# Patient Record
Sex: Female | Born: 1970 | Race: White | Hispanic: No | Smoking: Never smoker
Health system: Southern US, Community
[De-identification: ages and names within clinical notes are randomized; demographics above are authoritative.]

---

## 2007-08-25 ENCOUNTER — Ambulatory Visit: Payer: Self-pay | Admitting: Vascular Surgery

## 2010-05-21 ENCOUNTER — Emergency Department (HOSPITAL_COMMUNITY): Payer: No Typology Code available for payment source

## 2010-05-21 ENCOUNTER — Emergency Department (HOSPITAL_COMMUNITY)
Admission: EM | Admit: 2010-05-21 | Discharge: 2010-05-21 | Disposition: A | Discharge status: Home / Self Care | Payer: No Typology Code available for payment source | Attending: Emergency Medicine | Admitting: Emergency Medicine

## 2010-05-21 DIAGNOSIS — M542 Cervicalgia: Secondary | ICD-10-CM | POA: Insufficient documentation

## 2010-05-21 DIAGNOSIS — S335XXA Sprain of ligaments of lumbar spine, initial encounter: Secondary | ICD-10-CM | POA: Insufficient documentation

## 2010-05-21 DIAGNOSIS — M5430 Sciatica, unspecified side: Secondary | ICD-10-CM | POA: Insufficient documentation

## 2010-05-21 DIAGNOSIS — Y92410 Unspecified street and highway as the place of occurrence of the external cause: Secondary | ICD-10-CM | POA: Insufficient documentation

## 2010-07-09 NOTE — Procedures (Signed)
DUPLEX DEEP VENOUS EXAM - LOWER EXTREMITY   INDICATION:  Bilateral leg swelling.   HISTORY:  Edema:  Yes.  Trauma/Surgery:  No.  Pain:  With walking.  PE:  No.  Previous DVT:  No.  Anticoagulants:  No.  Other:  CHF.   DUPLEX EXAM:                CFV   SFV   PopV  PTV    GSV                R  L  R  L  R  L  R   L  R  L  Thrombosis    O  o  o  o  o  o  o   o  o  o  Spontaneous   +  +  +  +  +  +  +   +  +  +  Phasic        +  +  +  +  +  +  +   +  +  +  Augmentation  +  +  +  +  +  +  +   +  +  +  Compressible  +  +  +  +  +  +  +   +  +  +  Competent     +  +  +  +  +  +  +   +  +  +   Legend:  + - yes  o - no  p - partial  D - decreased   IMPRESSION:  No evidence of deep venous thrombosis or reflux noted in  the bilateral lower extremities.    _____________________________  Janetta Hora Fields, MD   CH/MEDQ  D:  08/25/2007  T:  08/25/2007  Job:  [id number]

## 2010-07-09 NOTE — Assessment & Plan Note (Signed)
OFFICE VISIT   Kaufman, Bethany  DOB:  1970/12/03                                       08/25/2007  CHART#:[id number]   The patient is a 40 year old female who complains of chronic leg  swelling.  She has also had areas of black and blue skin color changes  for approximately 2 months.  She has been on diuretics for greater than  2 years for leg swelling.  She denies any prior history of DVT.  She  denies any family history of varicose veins.  She states that her leg  swells as the day goes on.  She has a feeling of heaviness and aching as  the day goes on.  She denies history of diabetes, hypertension, of  coronary artery disease.  She has no history of elevated cholesterol.   PAST SURGICAL HISTORY:  Remarkable for C-section on 3 separate  occasions.  She also has had appendectomy, cholecystectomy and right  knee operation.   MEDICATIONS:  Include Proventil 2 sprays every 4 to 6 hours, Vicodin  p.r.n., alprazolam 0.5 mg twice a day for anxiety, Naprosyn 500 mg  p.r.n., amitriptyline 50 mg nightly, Skelaxin 800 mg q. of q.6 hours for  muscle spasms.  Potassium chloride extended release 10 mEq 2 tablets a  day, furosemide 40 mg once a day, Lyrica 50 mg 2 at bedtime, __________  50 mg 1 tablet daily.   She is allergic to aspirin, which causes nausea.   FAMILY HISTORY:  Unremarkable.   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  She has 3  children.  She is a former smoker and quit approximately 3 months ago.  She does not consume alcohol regularly.   REVIEW OF SYSTEMS:  She has had some recent weight gain.  She is 5 feet  10 inches, 270 pounds.  She has occasional chest pain and shortness of breath with exertion.  She also has a history of bronchitis and asthma.  She denies history of GI bleeding.  Psychiatric, ENT and hematologic review of systems are all negative.  Neurologically she has some occasional dizziness and headaches.  VASCULAR:   She denies any history of TIA or stroke.  RENAL:  She has some urinary frequency.   PHYSICAL EXAM:  Blood pressure 123/91 in the left arm, pulse is 79 and  regular.  HEENT:  Unremarkable.  Neck has 2+ carotid pulses without  bruit.  Chest:  Clear to auscultation.  Cardiac exam is regular rate  rhythm.  Abdomen is obese, soft, nontender, nondistended with no masses.  Extremities:  She has trace edema in the lower extremities.  She also  has a few scattered spider-type varicosities bilaterally in the calf and  thigh.  She has 2+ brachial, radial, and femoral pulses bilaterally.  She has 1+ dorsalis pedis and posterior tibial pulses bilaterally.   She had a venous duplex exam today which showed no evidence of  superficial or deep venous thrombosis.  She had no incompetence of her  superficial or deep femoral vein systems.   The patient has bilateral chronic leg swelling and pain.  This is  probably multifactorial in origin.  She has a problem with obesity as  well as maybe some mild varicosities in her lower extremities.  She also  has most likely some degenerative joint changes.  I do not believe that  she has incompetent valves in her veins based on her ultrasound.  Therefore, I do not believe she would benefit from laser ablation of her  saphenous vein.  I believe the best treatment for her would be a weight  loss program and also bilateral compression stockings of her lower  extremities.  I have prescribed these for her today.  She will also try  to take some measures to continue to lose weight over time.  She will  follow up with me on an as-needed basis.   Janetta Hora. Fields, MD  Electronically Signed   CEF/MEDQ  D:  08/29/2007  T:  08/30/2007  Job:  [id number]
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
## 2019-11-06 ENCOUNTER — Other Ambulatory Visit: Payer: Self-pay

## 2019-11-06 ENCOUNTER — Encounter (HOSPITAL_COMMUNITY): Payer: Self-pay

## 2019-11-06 ENCOUNTER — Ambulatory Visit (HOSPITAL_COMMUNITY)
Admission: EM | Admit: 2019-11-06 | Discharge: 2019-11-06 | Disposition: A | Discharge status: Home / Self Care | Payer: BC Managed Care – PPO | Attending: Physician Assistant | Admitting: Physician Assistant

## 2019-11-06 DIAGNOSIS — J069 Acute upper respiratory infection, unspecified: Secondary | ICD-10-CM

## 2019-11-06 MED ORDER — FLUTICASONE PROPIONATE 50 MCG/ACT NA SUSP: 1.0000 | Freq: Every day | NASAL | 2 refills | Status: AC

## 2019-11-06 MED ORDER — GUAIFENESIN-DM 100-10 MG/5ML PO SYRP: 5.0000 mL | ORAL_SOLUTION | ORAL | 0 refills | Status: AC | PRN

## 2019-11-06 NOTE — ED Triage Notes (Signed)
Pt presents with nasal congestion and cough x 3 days. States last time she felt this way it was allergies  Denies fever, sob, body aches. Mucinex and cetirizine gives no relief.  Pt refused COVID test.

## 2019-11-06 NOTE — Discharge Instructions (Signed)
Take medicines as prescribed [facility] over-the-counter Zyrtec  Hydrate well  You refused Covid testing, and your symptoms are consistent with early signs of Covid.  Because of this you need to quarantine for 10 days from symptom onset.  You may leave isolatio at the end of 10 days of all symptoms and improving if not had a fever for 24 hours leading up to this  If severe symptoms arise or shortness of breath, high fevers or other concerning symptoms return or go to the emergency department

## 2019-11-06 NOTE — ED Provider Notes (Signed)
Patient [facility]    CSN: [id number] Arrival date & time: 11/06/19  1610      History   Chief Complaint Chief Complaint  Patient presents with  . Nasal Congestion  . Cough    HPI Meyah Corle is a 48 y.o. female.   Patient presents for 3-day history of nasal congestion and cough.  She reports cough is nonproductive.  Has had clear nasal discharge.  Some facial pressure.  Some ear pressure.  No difficulty breathing.  Denies fever, chills, sore throat, nausea, vomiting or diarrhea.  No change in taste smell.  She did not receive Covid vaccines.  She would not like to be Covid tested today as she believes this is her allergies.  She has tried Claritin-D without any relief.  She had not been around by sick that she knows of.     History reviewed. No pertinent past medical history.  There are no problems to display for this patient.   History reviewed. No pertinent surgical history.  OB History   No obstetric history on file.      Home Medications    Prior to Admission medications   Medication Sig Start Date End Date Taking? Authorizing Provider  fluticasone (FLONASE) 50 MCG/ACT nasal spray Place 1 spray into both nostrils daily. 11/06/19   Rosario Kushner, Veryl Speak, PA-C  guaiFENesin-dextromethorphan (ROBITUSSIN DM) 100-10 MG/5ML syrup Take 5 mLs by mouth every 4 (four) hours as needed for cough. 11/06/19   Amado Andal, Veryl Speak, PA-C    Family History History reviewed. No pertinent family history.  Social History Social History   Tobacco Use  . Smoking status: Never Smoker  . Smokeless tobacco: Never Used  Substance Use Topics  . Alcohol use: Never  . Drug use: Never     Allergies   Strawberry extract, Aspirin, and Morphine   Review of Systems Review of Systems   Physical Exam Triage Vital Signs ED Triage Vitals  Enc Vitals Group     BP 11/06/19 1744 (!) 144/88     Pulse Rate 11/06/19 1744 86     Resp 11/06/19 1744 20     Temp 11/06/19 1744 98.9 F (37.2  C)     Temp Source 11/06/19 1744 Oral     SpO2 11/06/19 1744 96 %     Weight --      Height --      Head Circumference --      Peak Flow --      Pain Score 11/06/19 1742 0     Pain Loc --      Pain Edu? --      Excl. in GC? --    No data found.  Updated Vital Signs BP (!) 144/88 (BP Location: Left Arm)   Pulse 86   Temp 98.9 F (37.2 C) (Oral)   Resp 20   SpO2 96%   Visual Acuity Right Eye Distance:   Left Eye Distance:   Bilateral Distance:    Right Eye Near:   Left Eye Near:    Bilateral Near:     Physical Exam Vitals and nursing note reviewed.  Constitutional:      General: She is not in acute distress.    Appearance: She is well-developed. She is not ill-appearing.  HENT:     Head: Normocephalic and atraumatic.     Right Ear: Tympanic membrane normal.     Left Ear: Tympanic membrane normal.     Nose: Congestion present. No rhinorrhea.  Mouth/Throat:     Mouth: Mucous membranes are moist.     Pharynx: No oropharyngeal exudate or posterior oropharyngeal erythema.  Eyes:     Conjunctiva/sclera: Conjunctivae normal.  Cardiovascular:     Rate and Rhythm: Normal rate and regular rhythm.     Heart sounds: No murmur heard.   Pulmonary:     Effort: Pulmonary effort is normal. No respiratory distress.     Breath sounds: Normal breath sounds. No wheezing, rhonchi or rales.  Abdominal:     Palpations: Abdomen is soft.     Tenderness: There is no abdominal tenderness.  Musculoskeletal:     Cervical back: Neck supple.  Skin:    General: Skin is warm and dry.  Neurological:     Mental Status: She is alert.      UC Treatments / Results  Labs (all labs ordered are listed, but only abnormal results are displayed) Labs Reviewed - No data to display  EKG   Radiology No results found.  Procedures Procedures (including critical care time)  Medications Ordered in UC Medications - No data to display  Initial Impression / Assessment and Plan / UC  Course  I have reviewed the triage vital signs and the nursing notes.  Pertinent labs & imaging results that were available during my care of the patient were reviewed by me and considered in my medical decision making (see chart for details).     #Viral URI Patient 49 year old presenting with viral upper respiratory symptoms.  Afebrile with normal vital signs.  Reassuring exam.  Patient refused Covid testing.  Discussed with her that given she has upper respiratory symptoms and she is refusing Covid testing, that she should quarantine for 10 days from symptom onset per [facility] guidance.  Patient states she is concerned about missing work, I stated that Covid testing would actually allow her to return to work safely sooner as she should quarantine per the 10-day guidance that she is not receiving a test.  Patient still refuses.  We will treat her symptomatically.  Discussed return, follow-up and emergency for precautions.  Patient verbalized agreement understanding plan of care. Final Clinical Impressions(s) / UC Diagnoses   Final diagnoses:  Viral upper respiratory tract infection     Discharge Instructions     Take medicines as prescribed [facility] over-the-counter Zyrtec  Hydrate well  You refused Covid testing, and your symptoms are consistent with early signs of Covid.  Because of this you need to quarantine for 10 days from symptom onset.  You may leave isolatio at the end of 10 days of all symptoms and improving if not had a fever for 24 hours leading up to this  If severe symptoms arise or shortness of breath, high fevers or other concerning symptoms return or go to the emergency department      ED Prescriptions    Medication Sig Dispense Auth. Provider   guaiFENesin-dextromethorphan (ROBITUSSIN DM) 100-10 MG/5ML syrup Take 5 mLs by mouth every 4 (four) hours as needed for cough. 118 mL Lizza Huffaker, Veryl Speak, PA-C   fluticasone (FLONASE) 50 MCG/ACT nasal spray Place 1 spray into both  nostrils daily. 15.8 mL Jamaine Quintin, Veryl Speak, PA-C     PDMP not reviewed this encounter.   Hermelinda Medicus, PA-C 11/06/19 2235
